# Patient Record
Sex: Male | Born: 1985 | Race: White | Hispanic: No | Marital: Single | Smoking: Current every day smoker
Health system: Southern US, Community
[De-identification: ages and names within clinical notes are randomized; demographics above are authoritative.]

## PROBLEM LIST (undated history)

## (undated) DIAGNOSIS — F1910 Other psychoactive substance abuse, uncomplicated: Secondary | ICD-10-CM

## (undated) HISTORY — PX: FRACTURE SURGERY: SHX138

## (undated) HISTORY — PX: TRACHEOSTOMY: SUR1362

---

## 2015-04-20 ENCOUNTER — Encounter (HOSPITAL_COMMUNITY): Payer: Self-pay | Admitting: Emergency Medicine

## 2015-04-20 ENCOUNTER — Emergency Department (HOSPITAL_COMMUNITY)
Admission: EM | Admit: 2015-04-20 | Discharge: 2015-04-20 | Disposition: A | Discharge status: Home / Self Care | Payer: Self-pay | Attending: Emergency Medicine | Admitting: Emergency Medicine

## 2015-04-20 ENCOUNTER — Emergency Department (HOSPITAL_COMMUNITY): Payer: Self-pay

## 2015-04-20 DIAGNOSIS — Z720 Tobacco use: Secondary | ICD-10-CM | POA: Insufficient documentation

## 2015-04-20 DIAGNOSIS — M25552 Pain in left hip: Secondary | ICD-10-CM

## 2015-04-20 DIAGNOSIS — S80212A Abrasion, left knee, initial encounter: Secondary | ICD-10-CM | POA: Insufficient documentation

## 2015-04-20 DIAGNOSIS — S00212A Abrasion of left eyelid and periocular area, initial encounter: Secondary | ICD-10-CM | POA: Insufficient documentation

## 2015-04-20 DIAGNOSIS — Y9389 Activity, other specified: Secondary | ICD-10-CM | POA: Insufficient documentation

## 2015-04-20 DIAGNOSIS — Y92410 Unspecified street and highway as the place of occurrence of the external cause: Secondary | ICD-10-CM | POA: Insufficient documentation

## 2015-04-20 DIAGNOSIS — S70212A Abrasion, left hip, initial encounter: Secondary | ICD-10-CM | POA: Insufficient documentation

## 2015-04-20 DIAGNOSIS — Y998 Other external cause status: Secondary | ICD-10-CM | POA: Insufficient documentation

## 2015-04-20 DIAGNOSIS — S0083XA Contusion of other part of head, initial encounter: Secondary | ICD-10-CM | POA: Insufficient documentation

## 2015-04-20 DIAGNOSIS — S46912A Strain of unspecified muscle, fascia and tendon at shoulder and upper arm level, left arm, initial encounter: Secondary | ICD-10-CM | POA: Insufficient documentation

## 2015-04-20 DIAGNOSIS — S0081XA Abrasion of other part of head, initial encounter: Secondary | ICD-10-CM | POA: Insufficient documentation

## 2015-04-20 DIAGNOSIS — S40212A Abrasion of left shoulder, initial encounter: Secondary | ICD-10-CM | POA: Insufficient documentation

## 2015-04-20 MED ORDER — HYDROMORPHONE HCL 1 MG/ML IJ SOLN
1.0000 mg | Freq: Once | INTRAMUSCULAR | Status: AC
  Administered 2015-04-20: 1 mg via INTRAMUSCULAR
  Filled 2015-04-20: qty 1

## 2015-04-20 MED ORDER — HYDROCODONE-ACETAMINOPHEN 5-325 MG PO TABS: 1.0000 | ORAL_TABLET | ORAL | Status: AC | PRN
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
## 2015-04-20 NOTE — ED Notes (Signed)
Patient with no complaints at this time. Respirations even and unlabored. Skin warm/dry. Discharge instructions reviewed with patient at this time. Patient given opportunity to voice concerns/ask questions. Patient discharged at this time and left Emergency Department with steady gait.   

## 2015-04-20 NOTE — ED Notes (Signed)
Patient informed of xray results.  Asking to smoke.  Dr. Adriana Simas informed that xray reports are back and patient anxious to smoke.  Will d/c momentarily

## 2015-04-20 NOTE — Discharge Instructions (Signed)
X-ray show no fracture. You'll be sore for several days. Keep wounds clean and apply an aquatic ointment. Medication for pain. Follow-up with orthopedic surgeon if bony pain continues
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
## 2015-04-29 ENCOUNTER — Emergency Department (HOSPITAL_COMMUNITY)
Admission: EM | Admit: 2015-04-29 | Discharge: 2015-04-29 | Disposition: A | Discharge status: Home / Self Care | Payer: Self-pay | Attending: Emergency Medicine | Admitting: Emergency Medicine

## 2015-04-29 ENCOUNTER — Emergency Department (HOSPITAL_COMMUNITY): Payer: Self-pay

## 2015-04-29 ENCOUNTER — Encounter (HOSPITAL_COMMUNITY): Payer: Self-pay | Admitting: *Deleted

## 2015-04-29 DIAGNOSIS — Z720 Tobacco use: Secondary | ICD-10-CM | POA: Insufficient documentation

## 2015-04-29 DIAGNOSIS — M25552 Pain in left hip: Secondary | ICD-10-CM

## 2015-04-29 DIAGNOSIS — Y9289 Other specified places as the place of occurrence of the external cause: Secondary | ICD-10-CM | POA: Insufficient documentation

## 2015-04-29 DIAGNOSIS — S7002XA Contusion of left hip, initial encounter: Secondary | ICD-10-CM | POA: Insufficient documentation

## 2015-04-29 DIAGNOSIS — Y9389 Activity, other specified: Secondary | ICD-10-CM | POA: Insufficient documentation

## 2015-04-29 DIAGNOSIS — Y998 Other external cause status: Secondary | ICD-10-CM | POA: Insufficient documentation

## 2015-04-29 MED ORDER — KETOROLAC TROMETHAMINE 30 MG/ML IJ SOLN
30.0000 mg | Freq: Once | INTRAMUSCULAR | Status: AC
  Administered 2015-04-29: 30 mg via INTRAVENOUS
  Filled 2015-04-29: qty 1

## 2015-04-29 MED ORDER — OXYCODONE-ACETAMINOPHEN 5-325 MG PO TABS: 2.0000 | ORAL_TABLET | Freq: Four times a day (QID) | ORAL | Status: DC | PRN

## 2015-04-29 MED ORDER — HYDROMORPHONE HCL 1 MG/ML IJ SOLN
1.0000 mg | Freq: Once | INTRAMUSCULAR | Status: AC
  Administered 2015-04-29: 1 mg via INTRAVENOUS
  Filled 2015-04-29: qty 1

## 2015-04-29 MED ORDER — SODIUM CHLORIDE 0.9 % IV BOLUS (SEPSIS)
500.0000 mL | Freq: Once | INTRAVENOUS | Status: AC
  Administered 2015-04-29: 500 mL via INTRAVENOUS

## 2015-04-29 MED ORDER — OXYCODONE-ACETAMINOPHEN 5-325 MG PO TABS: 2.0000 | ORAL_TABLET | ORAL | Status: DC | PRN

## 2015-04-29 MED ORDER — HYDROMORPHONE HCL 1 MG/ML IJ SOLN
1.0000 mg | Freq: Once | INTRAMUSCULAR | Status: AC
  Administered 2015-04-29: 1 mg via INTRAMUSCULAR
  Filled 2015-04-29: qty 1

## 2015-04-29 NOTE — Discharge Instructions (Signed)
°Emergency Department Resource Guide °1) Find a Doctor and Pay Out of Pocket °Although you won't have to find out who is covered by your insurance plan, it is a good idea to ask around and get recommendations. You will then need to call the office and see if the doctor you have chosen will accept you as a new patient and what types of options they offer for patients who are self-pay. Some doctors offer discounts or will set up payment plans for their patients who do not have insurance, but you will need to ask so you aren't surprised when you get to your appointment. ° °2) Contact Your Local Health Department °Not all health departments have doctors that can see patients for sick visits, but many do, so it is worth a call to see if yours does. If you don't know where your local health department is, you can check in your phone book. The CDC also has a tool to help you locate your state's health department, and many state websites also have listings of all of their local health departments. ° °3) Find a Walk-in Clinic °If your illness is not likely to be very severe or complicated, you may want to try a walk in clinic. These are popping up all over the country in pharmacies, drugstores, and shopping centers. They're usually staffed by nurse practitioners or physician assistants that have been trained to treat common illnesses and complaints. They're usually fairly quick and inexpensive. However, if you have serious medical issues or chronic medical problems, these are probably not your best option. ° °No Primary Care Doctor: °- Call [facility] at  832-8000 - they can help you locate a primary care doctor that  accepts your insurance, provides certain services, etc. °- Physician Referral Service- 1-800-533-3463 ° °Chronic Pain Problems: °Organization         Address  Phone   Notes  °[facility]  (336) 297-2271 Patients need to be referred by their primary care doctor.  ° °Medication  Assistance: °Organization         Address  Phone   Notes  °[place] [facility] [address] °[place], [place] [address] (336) 641-8030 --Must be a resident of [place] °-- Must have NO insurance coverage whatsoever (no Medicaid/ Medicare, etc.) °-- The pt. MUST have a primary care doctor that directs their care regularly and follows them in the community °  °[facility]  (866) 331-1348   °[employer]  (888) 892-1162   ° °Agencies that provide inexpensive medical care: °Organization         Address  Phone   Notes  °[facility]  (336) 832-8035   °[facility]    (336) 832-7272   °[facility] [address] °[place], [place] [address] (336) 832-4777   °[facility] [address], °[place] (336) 271-4999   °[facility]    (336) 373-0678   °[facility]    (336) 272-1050   °[facility] ° [address], [place] Phone:  (336) 832-4444, Fax:  (336) 832-4440 Hours of Operation:  9 am - 6 pm, M-F.  Also accepts Medicaid/Medicare and self-pay.  °[facility] ° [address], [place] Phone: (336) 832-3150, Fax: (336) 832-3151. Hours of Operation:  8:30 am - 5:30 pm, M-F.  Also accepts Medicaid and self-pay.  °[facility] [place] [address], [place] Phone: (336) 878-6027   °[facility] [address], [place], [place] (336)723-1848, Ext. 123 Mondays & Thursdays: 7-9 AM.  First 15 patients are seen on a first come, first serve basis. °  ° °Medicaid-accepting [place] Providers: ° °Organization         Address  Phone   Notes  °[facility] [address], [place] (336) 641-2100 Also accepts self-pay patients.  °[facility] [address], [place] ° (336) 856-9996   °[facility] [address], [place]  (336) 288-8857   °[facility] [address] [place] [address], [place] (336) 299-7000   °Veita Bland [address], [place]  ° (336) 373-1557 Only accepts [place] Access Medicaid patients after they have their name applied to their card.  ° °Self-Pay (no insurance) in [place]: ° °Organization         Address  Phone   Notes  °Sickle Cell Patients, [facility] [address], [place] (336) 832-1970   °[facility] [address], [place] (336) 832-4400   °[facility] ° [address], [place] (336) 992-4800   °[facility]/Dr. Osei-Bonsu ° [address] [place] [address], [place] or [address], [place] (336) 841-8500 Phone number for both [place] and [place] locations is the same.  °[facility] [address], [place] (336) 299-0000   °[facility] [address] [place] [address], [place] or [address] (336) 852-7530 °(336) 878-2260   °[facility] [address], [place] (336) 350-1642, phone; (336) 294-5005, fax Sees patients 1st and 3rd Saturday of every month.  Must not qualify for public or private insurance (i.e. Medicaid, Medicare, [place] Health Choice, Veterans' Benefits) • Household income should be no more than 200% of the poverty level •The clinic cannot treat you if you are pregnant or think you are pregnant • Sexually transmitted diseases are not treated at the clinic.  ° ° °Dental Care: °Organization         Address  Phone  Notes  °[place] [facility] [address], [place] (336) 641-6152 Accepts children up to age 21 who are enrolled in Medicaid or [place] Health Choice; pregnant women with a Medicaid card; and children who have applied for Medicaid or [place] Health Choice, but were declined, whose parents can pay a reduced fee at time of service.  °[place]  [facility] [place]  [address], [place] (336) 641-7733 Accepts children up to age 21 who are enrolled in Medicaid or [place] Health Choice; pregnant women with a Medicaid card; and children who have applied for Medicaid or [place] Health Choice, but were declined, whose parents can pay a reduced fee at time of service.  °[facility] ° [address], [place] (336) 641-4533 Patients are seen by appointment only. Walk-ins are not accepted. [facility] will see patients 18 years of age and older. °Monday - Tuesday (8am-5pm) °Most Wednesdays (8:30-5pm) °$30 per visit, cash only  °[facility] ° [address], [place] (336) 641-4533 Patients are seen by appointment only. Walk-ins are not accepted. [facility] will see patients 18 years of age and older. °One   Wednesday Evening (Monthly: Volunteer Based).  $30 per visit, cash only  °[facility]  (919) 537-3737 for adults; Children under age 4, call Graduate Pediatric Dentistry at (919) [phone]. Children aged 4-14, please call (919) [phone] to request a pediatric application. ° Dental services are provided in all areas of dental care including fillings, crowns and bridges, complete and partial dentures, implants, gum treatment, root canals, and extractions. Preventive care is also provided. Treatment is provided to both adults and children. °Patients are selected via a lottery and there is often a waiting list. °  °[facility] [address], °[place] ° (336) 763-8833 [email or web address] °  °[facility] [address], [place], [place] (336)723-1848, Ext. 123 Second and Fourth Thursday of each month, opens at 6:30 AM; Clinic ends at 9 AM.  Patients are seen on a first-come first-served basis, and a limited number are seen during each clinic.  ° °[facility] ° [address], [place], [place] (336) 723-7904    Eligibility Requirements °You must have lived in [place], [place], or [place] counties for at least the last three months. °  You cannot be eligible for state or federal sponsored healthcare insurance, including Veterans Administration, Medicaid, or Medicare. °  You generally cannot be eligible for healthcare insurance through your employer.  °  How to apply: °Eligibility screenings are held every Tuesday and Wednesday afternoon from 1:00 pm until 4:00 pm. You do not need an appointment for the interview!  °[facility] [address], [place], [place] 336-631-2330   °[facility]  336-342-8273   °[facility]  336-703-3100   °[facility]  336-570-6415   ° °Behavioral Health Resources in the Community: °Intensive Outpatient Programs °Organization         Address  Phone  Notes  °[facility] [address], [place], [place] 336-878-6098   °[facility] [address], [place], [place] 336-832-9800   °[facility] [address], [place], [place] ° 336-882-2125   °[facility] [address],  °[place], [place] 1-800-853-5163 or 336-641-4981   °Substance Abuse Resources °Organization         Address  Phone  Notes  °[facility]  336-882-2125   °[facility]  336-784-9470   °[facility]  336-285-9073   °[facility]  336-845-3988   °Residential & Outpatient Substance Abuse Program  1-800-659-3381   °Psychological Services °Organization         Address  Phone  Notes  °[facility]  336- 832-9600   °[facility]  336- 378-7881   °[facility] [address], [place] 1-800-853-5163 or 336-641-4981   ° °Mobile Crisis Teams °Organization         Address  Phone  Notes  °[facility], Mobile Crisis Care Unit  1-877-626-1772   °Assertive °[facility] ° [address]  [place], [place] 336-834-9664   °Sharon DeEsch [address] °[place] 336-554-5454   ° °Self-Help/Support Groups °Organization         Address  Phone             Notes  °[facility]  - variety of support groups  336- 373-1402 Call for more information  °Narcotics Anonymous (NA), [facility] [address], °[place]  2 meetings at this location  ° °  Residential Treatment Programs Organization         Address  Phone  Notes  [facility] [address],    [place]  0-973-532-9924   [facility]  [address] [place] [address], [place], [place] [phone]   [facility] [address] [place], [place] 211-941-7408 Admissions: 8am-3pm M-F  [facility] [address],    [place], [place] 144-818-5631   [facility] [address] [place], [place], [place] 497-026-3785   [facility] [address]  [place], [place] 885-027-7412   [facility] Intensive Outpatient [address], [place], [place] 878-676-7209   [facility] ([facility] [address]  [place], [place] 4-709-628-3662 or (804) 085-2555   [facility] [address] [place], [place] 546-568-1275 Accepts Medicaid  [facility] [address]  [place] 1-700-174-9449 Substance Abuse/Addiction Treatment   [facility] Organization         Address  Phone  Notes  [facility]  985-069-0470   Angie Fava, PhD [address] [place], [place]   (916) 761-6130 or 727-449-4634   [facility]   [address] [place], [place] (815) 429-1498   [facility] [address], [place], [place] 256-737-4037 Insurance/Medicaid/sponsorship through [facility] [address]                                    [place], [place] 215-163-2413 Therapy/tele-psych/case    [facility] [address], [place] [phone]    Dr. Lolly Mustache  (203)701-9860   [facility]  [employer] [facility] Dept. 1) [address] [place] 2) [address] [place] 3)  [address], [place] [phone] 337-415-5511  [phone]   [facility] Child Abuse Hotline 509-464-5420 or 989 252 1288 (After Hours)        CT scan shows a large hematoma on the soft tissue of your  left hip.  Minimize activity. Ice pack. Referral to orthopedic surgeon. Resource guide given.

## 2015-04-29 NOTE — ED Notes (Signed)
Advised pt  that  He has to remain NPO.
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
## 2015-04-30 ENCOUNTER — Telehealth: Payer: Self-pay | Admitting: Orthopedic Surgery

## 2015-04-30 NOTE — Telephone Encounter (Signed)
Patient is calling stating that the ER told him to f/u with Dr. Romeo Apple for a blood clot in his hip, No insurance on file,patient would be self pay. Is this problem something that you would take care of ?

## 2015-04-30 NOTE — Telephone Encounter (Signed)
Tried to call the patient back to schedule and his phone in not currently working

## 2015-04-30 NOTE — Telephone Encounter (Signed)
3 pm on the 29th

## 2015-05-01 NOTE — Telephone Encounter (Signed)
Tried to reach again today, phone not currently working

## 2015-05-02 NOTE — Telephone Encounter (Signed)
PATIENT IS SCHEDULED ON 05/08/15 AND HE IS AWARE

## 2015-05-03 ENCOUNTER — Encounter (HOSPITAL_COMMUNITY): Payer: Self-pay | Admitting: Cardiology

## 2015-05-03 ENCOUNTER — Emergency Department (HOSPITAL_COMMUNITY)
Admission: EM | Admit: 2015-05-03 | Discharge: 2015-05-03 | Disposition: A | Discharge status: Home / Self Care | Payer: Self-pay | Attending: Emergency Medicine | Admitting: Emergency Medicine

## 2015-05-03 DIAGNOSIS — Z760 Encounter for issue of repeat prescription: Secondary | ICD-10-CM | POA: Insufficient documentation

## 2015-05-03 DIAGNOSIS — S7002XD Contusion of left hip, subsequent encounter: Secondary | ICD-10-CM | POA: Insufficient documentation

## 2015-05-03 DIAGNOSIS — T148XXA Other injury of unspecified body region, initial encounter: Secondary | ICD-10-CM

## 2015-05-03 DIAGNOSIS — Z720 Tobacco use: Secondary | ICD-10-CM | POA: Insufficient documentation

## 2015-05-03 MED ORDER — OXYCODONE-ACETAMINOPHEN 7.5-325 MG PO TABS: 1.0000 | ORAL_TABLET | ORAL | Status: AC | PRN

## 2015-05-03 NOTE — Discharge Instructions (Signed)
Hematoma A hematoma is a collection of blood under the skin, in an organ, in a body space, in a joint space, or in other tissue. The blood can clot to form a lump that you can see and feel. The lump is often firm and may sometimes become sore and tender. Most hematomas get better in a few days to weeks. However, some hematomas may be serious and require medical care. Hematomas can range in size from very small to very large. CAUSES  A hematoma can be caused by a blunt or penetrating injury. It can also be caused by spontaneous leakage from a blood vessel under the skin. Spontaneous leakage from a blood vessel is more likely to occur in older people, especially those taking blood thinners. Sometimes, a hematoma can develop after certain medical procedures. SIGNS AND SYMPTOMS   A firm lump on the body.  Possible pain and tenderness in the area.  Bruising.Blue, dark blue, purple-red, or yellowish skin may appear at the site of the hematoma if the hematoma is close to the surface of the skin. For hematomas in deeper tissues or body spaces, the signs and symptoms may be subtle. For example, an intra-abdominal hematoma may cause abdominal pain, weakness, fainting, and shortness of breath. An intracranial hematoma may cause a headache or symptoms such as weakness, trouble speaking, or a change in consciousness. DIAGNOSIS  A hematoma can usually be diagnosed based on your medical history and a physical exam. Imaging tests may be needed if your health care provider suspects a hematoma in deeper tissues or body spaces, such as the abdomen, head, or chest. These tests may include ultrasonography or a CT scan.  TREATMENT  Hematomas usually go away on their own over time. Rarely does the blood need to be drained out of the body. Large hematomas or those that may affect vital organs will sometimes need surgical drainage or monitoring. HOME CARE INSTRUCTIONS   Apply ice to the injured area:   Put ice in a  plastic bag.   Place a towel between your skin and the bag.   Leave the ice on for 20 minutes, 2-3 times a day for the first 1 to 2 days.   After the first 2 days, switch to using warm compresses on the hematoma.   Elevate the injured area to help decrease pain and swelling. Wrapping the area with an elastic bandage may also be helpful. Compression helps to reduce swelling and promotes shrinking of the hematoma. Make sure the bandage is not wrapped too tight.   If your hematoma is on a lower extremity and is painful, crutches may be helpful for a couple days.   Only take over-the-counter or prescription medicines as directed by your health care provider. SEEK IMMEDIATE MEDICAL CARE IF:   You have increasing pain, or your pain is not controlled with medicine.   You have a fever.   You have worsening swelling or discoloration.   Your skin over the hematoma breaks or starts bleeding.   Your hematoma is in your chest or abdomen and you have weakness, shortness of breath, or a change in consciousness.  Your hematoma is on your scalp (caused by a fall or injury) and you have a worsening headache or a change in alertness or consciousness. MAKE SURE YOU:   Understand these instructions.  Will watch your condition.  Will get help right away if you are not doing well or get worse. Document Released: 06/09/2004 Document Revised: 06/28/2013 Document Reviewed: 04/05/2013   ExitCare Patient Information 2015 [place], [place]. This information is not intended to replace advice given to you by your health care provider. Make sure you discuss any questions you have with your health care provider.   You do not have a blood clot within a vein, but within the soft tissue of your skin.  This cannot cause a pulmonary embolism.  You may take the oxycodone prescribed for pain relief.  This will make you drowsy - do not drive within 4 hours of taking this medication.
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
## 2015-05-06 MED FILL — Oxycodone w/ Acetaminophen Tab 5-325 MG: ORAL | Qty: 6 | Status: AC

## 2015-05-08 ENCOUNTER — Encounter: Payer: Self-pay | Admitting: Orthopedic Surgery

## 2015-05-08 ENCOUNTER — Ambulatory Visit: Payer: Self-pay | Admitting: Orthopedic Surgery
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
## 2017-05-25 ENCOUNTER — Encounter: Payer: Self-pay | Admitting: Emergency Medicine

## 2017-05-25 ENCOUNTER — Emergency Department
Admission: EM | Admit: 2017-05-25 | Discharge: 2017-05-25 | Disposition: A | Discharge status: Home / Self Care | Payer: Self-pay | Attending: Emergency Medicine | Admitting: Emergency Medicine

## 2017-05-25 DIAGNOSIS — F1410 Cocaine abuse, uncomplicated: Secondary | ICD-10-CM | POA: Insufficient documentation

## 2017-05-25 DIAGNOSIS — E860 Dehydration: Secondary | ICD-10-CM | POA: Insufficient documentation

## 2017-05-25 DIAGNOSIS — F17210 Nicotine dependence, cigarettes, uncomplicated: Secondary | ICD-10-CM | POA: Insufficient documentation

## 2017-05-25 DIAGNOSIS — F1510 Other stimulant abuse, uncomplicated: Secondary | ICD-10-CM | POA: Insufficient documentation

## 2017-05-25 HISTORY — DX: Other psychoactive substance abuse, uncomplicated: F19.10

## 2017-05-25 LAB — URINALYSIS, COMPLETE (UACMP) WITH MICROSCOPIC
Bilirubin Urine: NEGATIVE
Glucose, UA: NEGATIVE mg/dL
Hgb urine dipstick: NEGATIVE
KETONES UR: 5 mg/dL — AB
LEUKOCYTES UA: NEGATIVE
Nitrite: NEGATIVE
PROTEIN: 30 mg/dL — AB
Specific Gravity, Urine: 1.031 — ABNORMAL HIGH (ref 1.005–1.030)
pH: 5 (ref 5.0–8.0)

## 2017-05-25 LAB — CBC
HCT: 41.1 % (ref 40.0–52.0)
Hemoglobin: 14.2 g/dL (ref 13.0–18.0)
MCH: 28.8 pg (ref 26.0–34.0)
MCHC: 34.6 g/dL (ref 32.0–36.0)
MCV: 83.4 fL (ref 80.0–100.0)
Platelets: 198 10*3/uL (ref 150–440)
RBC: 4.93 MIL/uL (ref 4.40–5.90)
RDW: 14.8 % — AB (ref 11.5–14.5)
WBC: 6.5 10*3/uL (ref 3.8–10.6)

## 2017-05-25 LAB — BASIC METABOLIC PANEL
Anion gap: 9 (ref 5–15)
BUN: 19 mg/dL (ref 6–20)
CHLORIDE: 107 mmol/L (ref 101–111)
CO2: 25 mmol/L (ref 22–32)
CREATININE: 1.3 mg/dL — AB (ref 0.61–1.24)
Calcium: 9.4 mg/dL (ref 8.9–10.3)
GFR calc Af Amer: >60 mL/min (ref >60)
Glucose, Bld: 106 mg/dL — ABNORMAL HIGH (ref 65–99)
Potassium: 4.1 mmol/L (ref 3.5–5.1)
SODIUM: 141 mmol/L (ref 135–145)

## 2017-05-25 LAB — HEPATIC FUNCTION PANEL
ALBUMIN: 4.6 g/dL (ref 3.5–5.0)
ALT: 103 U/L — AB (ref 17–63)
AST: 50 U/L — AB (ref 15–41)
Alkaline Phosphatase: 52 U/L (ref 38–126)
Bilirubin, Direct: 0.2 mg/dL (ref 0.1–0.5)
Indirect Bilirubin: 1.1 mg/dL — ABNORMAL HIGH (ref 0.3–0.9)
TOTAL PROTEIN: 8.1 g/dL (ref 6.5–8.1)
Total Bilirubin: 1.3 mg/dL — ABNORMAL HIGH (ref 0.3–1.2)

## 2017-05-25 LAB — CK: Total CK: 168 U/L (ref 49–397)

## 2017-05-25 NOTE — ED Notes (Signed)
Patient walking in and out of room after EDP assessment.  MD printing discharge paperwork, patient walking down hall states "yall shouldn't have doors that say don't enter, it makes people want to go in them".  Patient took discharge paperwork from MD and walked out.  Refusing vitals and signature.

## 2017-05-25 NOTE — ED Provider Notes (Signed)
[facility] Emergency Department Provider Note  ____________________________________________   First MD Initiated Contact with Patient 05/25/17 1543     (approximate)  I have reviewed the triage vital signs and the nursing notes.   HISTORY  Chief Complaint Near Syncope   HPI Antonio Fields is a 31 y.o. male who comes to the emergency department requesting an evaluation because of his chronic drug use. He says that he uses "every drug under the sun" and frequently uses methamphetamine and cocaine intravenously. He has not used in roughly 24 hours. Today he went to work and the son felt lightheaded and felt like he nearly passed out. His past several times recently. He has no family history of sudden cardiac death. He has no preceding chest pain or palpitations. That he has hepatitis C and he wants to know how much longer he can continue to use drugs without hurting himself.   Past Medical History:  Diagnosis Date  . Substance abuse     There are no active problems to display for this patient.   Past Surgical History:  Procedure Laterality Date  . FRACTURE SURGERY    . TRACHEOSTOMY     had one 2009  . TRACHEOSTOMY      Prior to Admission medications   Medication Sig Start Date End Date Taking? Authorizing Provider  acetaminophen (TYLENOL) 500 MG tablet Take 1,000 mg by mouth every 6 (six) hours as needed for mild pain or moderate pain.    [provider]  HYDROcodone-acetaminophen (NORCO/VICODIN) 5-325 MG per tablet Take 1-2 tablets by mouth every 4 (four) hours as needed. Patient not taking: Reported on 04/29/2015 04/20/15   [name], MD  ibuprofen (ADVIL,MOTRIN) 200 MG tablet Take 800 mg by mouth every 6 (six) hours as needed for moderate pain.    [provider]  oxyCODONE-acetaminophen (PERCOCET) 7.5-325 MG per tablet Take 1 tablet by mouth every 4 (four) hours as needed for severe pain. 05/03/15   Burgess Amor, PA-C     Allergies Bee venom  Family History  Problem Relation Age of Onset  . Rheum arthritis Mother   . COPD Father     Social History Social History  Substance Use Topics  . Smoking status: Current Every Day Smoker    Packs/day: 1.00  . Smokeless tobacco: Never Used  . Alcohol use 3.6 oz/week    6 Cans of beer per week    Review of Systems Constitutional: No fever/chills Eyes: No visual changes. ENT: No sore throat. Cardiovascular: Denies chest pain. Respiratory: Denies shortness of breath. Gastrointestinal: No abdominal pain.  No nausea, no vomiting.  No diarrhea.  No constipation. Genitourinary: Negative for dysuria. Musculoskeletal: Negative for back pain. Skin: Negative for rash. Neurological: Negative for headaches, focal weakness or numbness.   ____________________________________________   PHYSICAL EXAM:  VITAL SIGNS: ED Triage Vitals [05/25/17 1352]  Enc Vitals Group     BP 136/87     Pulse Rate (!) 115     Resp 20     Temp 98 F (36.7 C)     Temp Source Oral     SpO2 97 %     Weight 200 lb (90.7 kg)     Height 6\' 2"  (1.88 m)     Head Circumference      Peak Flow      Pain Score      Pain Loc      Pain Edu?      Excl. in GC?  Constitutional: Alert and oriented 4 some psychomotor agitation pacing around the room Eyes: PERRL EOMI. pupils are 4 mm to 2 mm Head: Atraumatic. Nose: No congestion/rhinnorhea. Mouth/Throat: No trismus Neck: No stridor.   Cardiovascular: Tachycardic rate, regular rhythm. Grossly normal heart sounds.  Good peripheral circulation. Respiratory: Normal respiratory effort.  No retractions. Lungs CTAB and moving good air Gastrointestinal: Soft nontender Musculoskeletal: No lower extremity edema   Neurologic: Somewhat pressured speech No gross focal neurologic deficits are appreciated. Skin:  Skin is warm, dry and intact. No rash noted. Psychiatric: Anxious and  fidgeting    ____________________________________________   DIFFERENTIAL includes but not limited to  Abdomen mild lysis, dehydration, acute kidney injury, cocaine intoxication, methamphetamine intoxication ____________________________________________   LABS (all labs ordered are listed, but only abnormal results are displayed)  Labs Reviewed  BASIC METABOLIC PANEL - Abnormal; Notable for the following:       Result Value   Glucose, Bld 106 (*)    Creatinine, Ser 1.30 (*)    All other components within normal limits  CBC - Abnormal; Notable for the following:    RDW 14.8 (*)    All other components within normal limits  URINALYSIS, COMPLETE (UACMP) WITH MICROSCOPIC - Abnormal; Notable for the following:    Color, Urine AMBER (*)    APPearance CLOUDY (*)    Specific Gravity, Urine 1.031 (*)    Ketones, ur 5 (*)    Protein, ur 30 (*)    Bacteria, UA RARE (*)    Squamous Epithelial / LPF 0-5 (*)    All other components within normal limits  HEPATIC FUNCTION PANEL - Abnormal; Notable for the following:    AST 50 (*)    ALT 103 (*)    Total Bilirubin 1.3 (*)    Indirect Bilirubin 1.1 (*)    All other components within normal limits  CK    Elevated creatinine is abnormal __________________________________________  EKG  ED ECG REPORT I, Merrily Brittle, the attending physician, personally viewed and interpreted this ECG.  Date: 05/25/2017 Rate: 103 Rhythm: Sinus tachycardia QRS Axis: normal Intervals: normal ST/T Wave abnormalities: normal Narrative Interpretation: Incomplete right bundle-branch block but no signs of Brugada ________________________________________  RADIOLOGY   ____________________________________________   PROCEDURES  Procedure(s) performed: no  Procedures  Critical Care performed: no  Observation: no ____________________________________________   INITIAL IMPRESSION / ASSESSMENT AND PLAN / ED COURSE  Pertinent labs & imaging  results that were available during my care of the patient were reviewed by me and considered in my medical decision making (see chart for details).  The patient has multiple complaints. His primary issue is he wants generalized blood work to know whether or not his intravenous drug use as affected his body. Regarding his syncope his EKG is reassuring with no arrhythmia and no blocks no signs of Brugada syndrome and normal intervals. The patient has capacity to make medical decisions and clearly expresses a desire to continue using illicit drugs. He has no suicidal ideation no homicidal ideation and has a clear plan for self-care. I offered him evaluation by TTS and possible inpatient rehabilitation however he declines stating he has no intention to stop. At this point I do not believe he warrants an involuntary commitment and he is discharged home with primary care follow-up.      ____________________________________________   FINAL CLINICAL IMPRESSION(S) / ED DIAGNOSES  Final diagnoses:  Dehydration  Cocaine abuse  Methamphetamine abuse      NEW MEDICATIONS STARTED DURING THIS  VISIT:  New Prescriptions   No medications on file     Note:  This document was prepared using Dragon voice recognition software and may include unintentional dictation errors.     Merrily Brittleifenbark, Cynithia Hakimi, MD 05/25/17 1558

## 2017-05-25 NOTE — ED Triage Notes (Signed)
Pt comes into the ED via POV c/o near syncopal episodes that have been occurring for multiple weeks.  Patient almost fell off of a scaffolding earlier today at work.  Patient is concerned for dehydration and that his drug use may be the cause of it.  Multiple substance abuse but is not seeking help for this.  Patient admits to last use being last week.  Patient has even and unlabored respirations at this time and was ambulatory to triage.  Patient in NAD at this time but presents anxious.

## 2017-05-25 NOTE — ED Notes (Addendum)
Patient found walking out of room around hallway.  Asked patient if he needs anything and what he's doing.  States "walking around".  When asked to return to room patient states "I'm gonna go through all them cabinets".  Nurse entered room to assess patient at this time.  Afterwards updated MD who then entered room.  Patient unable to sit still during assessment and looking around the room constantly.

## 2017-05-25 NOTE — Discharge Instructions (Signed)
Today you are clearly dehydrated and kidneys are not functioning at 100%. Please increase the amount of water to drink and return to the emergency department for any concerns.  If you change your mind and want help stopping using cocaine or methamphetamine or any other drugs we are more than happy to help you out.  It was a pleasure to take care of you today, and thank you for coming to our emergency department.  If you have any questions or concerns before leaving please ask the nurse to grab me and I'm more than happy to go through your aftercare instructions again.  If you were prescribed any opioid pain medication today such as Norco, Vicodin, Percocet, morphine, hydrocodone, or oxycodone please make sure you do not drive when you are taking this medication as it can alter your ability to drive safely.  If you have any concerns once you are home that you are not improving or are in fact getting worse before you can make it to your follow-up appointment, please do not hesitate to call 911 and come back for further evaluation.  Merrily BrittleNeil Maki Hege MD  Results for orders placed or performed during the hospital encounter of 05/25/17  Basic metabolic panel  Result Value Ref Range   Sodium 141 135 - 145 mmol/L   Potassium 4.1 3.5 - 5.1 mmol/L   Chloride 107 101 - 111 mmol/L   CO2 25 22 - 32 mmol/L   Glucose, Bld 106 (H) 65 - 99 mg/dL   BUN 19 6 - 20 mg/dL   Creatinine, Ser 4.091.30 (H) 0.61 - 1.24 mg/dL   Calcium 9.4 8.9 - 81.110.3 mg/dL   GFR calc non Af Amer >60 >60 mL/min   GFR calc Af Amer >60 >60 mL/min   Anion gap 9 5 - 15  CBC  Result Value Ref Range   WBC 6.5 3.8 - 10.6 K/uL   RBC 4.93 4.40 - 5.90 MIL/uL   Hemoglobin 14.2 13.0 - 18.0 g/dL   HCT 91.441.1 78.240.0 - 95.652.0 %   MCV 83.4 80.0 - 100.0 fL   MCH 28.8 26.0 - 34.0 pg   MCHC 34.6 32.0 - 36.0 g/dL   RDW 21.314.8 (H) 08.611.5 - 57.814.5 %   Platelets 198 150 - 440 K/uL  Urinalysis, Complete w Microscopic  Result Value Ref Range   Color, Urine AMBER  (A) YELLOW   APPearance CLOUDY (A) CLEAR   Specific Gravity, Urine 1.031 (H) 1.005 - 1.030   pH 5.0 5.0 - 8.0   Glucose, UA NEGATIVE NEGATIVE mg/dL   Hgb urine dipstick NEGATIVE NEGATIVE   Bilirubin Urine NEGATIVE NEGATIVE   Ketones, ur 5 (A) NEGATIVE mg/dL   Protein, ur 30 (A) NEGATIVE mg/dL   Nitrite NEGATIVE NEGATIVE   Leukocytes, UA NEGATIVE NEGATIVE   RBC / HPF 0-5 0 - 5 RBC/hpf   WBC, UA 0-5 0 - 5 WBC/hpf   Bacteria, UA RARE (A) NONE SEEN   Squamous Epithelial / LPF 0-5 (A) NONE SEEN   Mucous PRESENT    Hyaline Casts, UA PRESENT    Ca Oxalate Crys, UA PRESENT   Hepatic function panel  Result Value Ref Range   Total Protein 8.1 6.5 - 8.1 g/dL   Albumin 4.6 3.5 - 5.0 g/dL   AST 50 (H) 15 - 41 U/L   ALT 103 (H) 17 - 63 U/L   Alkaline Phosphatase 52 38 - 126 U/L   Total Bilirubin 1.3 (H) 0.3 - 1.2 mg/dL  Bilirubin, Direct 0.2 0.1 - 0.5 mg/dL   Indirect Bilirubin 1.1 (H) 0.3 - 0.9 mg/dL  CK  Result Value Ref Range   Total CK 168 49 - 397 U/L
